# Patient Record
Sex: Male | Born: 1965
Health system: Southern US, Community
[De-identification: ages and names within clinical notes are randomized; demographics above are authoritative.]

## PROBLEM LIST (undated history)

## (undated) DIAGNOSIS — I10 Essential (primary) hypertension: Secondary | ICD-10-CM

## (undated) DIAGNOSIS — T7840XA Allergy, unspecified, initial encounter: Secondary | ICD-10-CM

## (undated) HISTORY — DX: Allergy, unspecified, initial encounter: T78.40XA

## (undated) HISTORY — DX: Essential (primary) hypertension: I10

## (undated) HISTORY — PX: OTHER SURGICAL HISTORY: SHX169

---

## 2004-02-24 ENCOUNTER — Encounter: Admission: RE | Admit: 2004-02-24 | Discharge: 2004-02-24 | Payer: Self-pay | Admitting: Family Medicine

## 2017-08-24 ENCOUNTER — Encounter: Payer: Self-pay | Admitting: Gastroenterology

## 2017-09-19 ENCOUNTER — Ambulatory Visit (AMBULATORY_SURGERY_CENTER): Payer: Self-pay | Admitting: *Deleted

## 2017-09-19 ENCOUNTER — Other Ambulatory Visit: Payer: Self-pay

## 2017-09-19 VITALS — Ht 69.0 in | Wt 265.6 lb

## 2017-09-19 DIAGNOSIS — Z1211 Encounter for screening for malignant neoplasm of colon: Secondary | ICD-10-CM

## 2017-09-19 MED ORDER — NA SULFATE-K SULFATE-MG SULF 17.5-3.13-1.6 GM/177ML PO SOLN: 1.0000 | Freq: Once | ORAL | 0 refills | Status: AC

## 2017-09-19 NOTE — Progress Notes (Signed)
No egg or soy allergy known to patient  No past sedation with any surgeries  or procedures, no past  intubation  No diet pills per patient No home 02 use per patient  No blood thinners per patient  Pt denies issues with constipation  No A fib or A flutter  EMMI video sent to pt's e mail - pt declined

## 2017-10-01 ENCOUNTER — Telehealth: Payer: Self-pay | Admitting: Gastroenterology

## 2017-10-01 NOTE — Telephone Encounter (Signed)
Do you want to charge patient?

## 2017-10-01 NOTE — Telephone Encounter (Signed)
Yes charge 

## 2017-10-03 ENCOUNTER — Encounter: Payer: Self-pay | Admitting: Gastroenterology

## 2018-01-15 ENCOUNTER — Ambulatory Visit (AMBULATORY_SURGERY_CENTER): Payer: Self-pay | Admitting: *Deleted

## 2018-01-15 VITALS — Ht 69.0 in | Wt 269.0 lb

## 2018-01-15 DIAGNOSIS — Z1211 Encounter for screening for malignant neoplasm of colon: Secondary | ICD-10-CM

## 2018-01-15 NOTE — Progress Notes (Signed)
No egg or soy allergy known to patient  No issues with past sedation with any surgeries  or procedures, no intubation problems  No diet pills per patient No home 02 use per patient  No blood thinners per patient  Pt denies issues with constipation  No A fib or A flutter  EMMI video sent to pt's e mail -- pt declined  Pt has a Suprep at home from his 09-19-17 PV

## 2018-01-23 ENCOUNTER — Encounter: Payer: Self-pay | Admitting: Gastroenterology

## 2018-01-23 ENCOUNTER — Ambulatory Visit (AMBULATORY_SURGERY_CENTER): Payer: Commercial Managed Care - PPO | Admitting: Gastroenterology

## 2018-01-23 VITALS — BP 119/74 | HR 69 | Temp 98.9°F | Resp 14 | Ht 69.0 in | Wt 269.0 lb

## 2018-01-23 DIAGNOSIS — Z1211 Encounter for screening for malignant neoplasm of colon: Secondary | ICD-10-CM

## 2018-01-23 DIAGNOSIS — D124 Benign neoplasm of descending colon: Secondary | ICD-10-CM | POA: Diagnosis not present

## 2018-01-23 DIAGNOSIS — D123 Benign neoplasm of transverse colon: Secondary | ICD-10-CM

## 2018-01-23 MED ORDER — SODIUM CHLORIDE 0.9 % IV SOLN: 500.0000 mL | Freq: Once | INTRAVENOUS | Status: DC

## 2018-01-23 NOTE — Progress Notes (Signed)
Called to room to assist during endoscopic procedure.  Patient ID and intended procedure confirmed with present staff. Received instructions for my participation in the procedure from the performing physician.  

## 2018-01-23 NOTE — Progress Notes (Signed)
To PACU, VSS. Report to [email or web address] 

## 2018-01-23 NOTE — Op Note (Signed)
[facility] Patient Name: Jose Warren Procedure Date: 01/23/2018 2:03 PM MRN: [id number] Endoscopist: Ladene Artist , MD Age: 52 Referring MD:  Date of Birth: 08-23-1965 Gender: Male Account #: [id number] Procedure:                Colonoscopy Indications:              Screening for colorectal malignant neoplasm Medicines:                Monitored Anesthesia Care Procedure:                Pre-Anesthesia Assessment:                           - Prior to the procedure, a History and Physical                            was performed, and patient medications and                            allergies were reviewed. The patient's tolerance of                            previous anesthesia was also reviewed. The risks                            and benefits of the procedure and the sedation                            options and risks were discussed with the patient.                            All questions were answered, and informed consent                            was obtained. Prior Anticoagulants: The patient has                            taken no previous anticoagulant or antiplatelet                            agents. ASA Grade Assessment: II - A patient with                            mild systemic disease. After reviewing the risks                            and benefits, the patient was deemed in                            satisfactory condition to undergo the procedure.                           After obtaining informed consent, the colonoscope  was passed under direct vision. Throughout the                            procedure, the patient's blood pressure, pulse, and                            oxygen saturations were monitored continuously. The                            Colonoscope was introduced through the anus and                            advanced to the the cecum, identified by                            appendiceal orifice and  ileocecal valve. The                            ileocecal valve, appendiceal orifice, and rectum                            were photographed. The quality of the bowel                            preparation was excellent. The colonoscopy was                            performed without difficulty. The patient tolerated                            the procedure well. Scope In: 2:09:28 PM Scope Out: 2:24:01 PM Scope Withdrawal Time: 0 hours 12 minutes 20 seconds  Total Procedure Duration: 0 hours 14 minutes 33 seconds  Findings:                 The perianal and digital rectal examinations were                            normal.                           Two sessile polyps were found in the descending                            colon and transverse colon. The polyps were 7 mm in                            size. These polyps were removed with a cold snare.                            Resection and retrieval were complete.                           Multiple medium-mouthed diverticula were found in  the left colon. There was no evidence of                            diverticular bleeding.                           Internal hemorrhoids were found during                            retroflexion. The hemorrhoids were small and Grade                            I (internal hemorrhoids that do not prolapse).                           The exam was otherwise without abnormality on                            direct and retroflexion views. Complications:            No immediate complications. Estimated blood loss:                            None. Estimated Blood Loss:     Estimated blood loss: none. Impression:               - Two 7 mm polyps in the descending colon and in                            the transverse colon, removed with a cold snare.                            Resected and retrieved.                           - Moderate diverticulosis in the left colon.                            - Internal hemorrhoids.                           - The examination was otherwise normal on direct                            and retroflexion views. Recommendation:           - Repeat colonoscopy in 5 years for surveillance if                            polyp(s) are precancerous, otherwise 10 years.                           - Patient has a contact number available for                            emergencies. The signs and symptoms of potential  delayed complications were discussed with the                            patient. Return to normal activities tomorrow.                            Written discharge instructions were provided to the                            patient.                           - High fiber diet.                           - Continue present medications.                           - Await pathology results. Ladene Artist, MD 01/23/2018 2:27:36 PM This report has been signed electronically.

## 2018-01-23 NOTE — Progress Notes (Signed)
Pt's states no medical or surgical changes since previsit or office visit. 

## 2018-01-23 NOTE — Patient Instructions (Signed)
**   Handouts given on polyps and diverticulosis **   YOU HAD AN ENDOSCOPIC PROCEDURE TODAY AT THE [facility]:   Refer to the procedure report that was given to you for any specific questions about what was found during the examination.  If the procedure report does not answer your questions, please call your gastroenterologist to clarify.  If you requested that your care partner not be given the details of your procedure findings, then the procedure report has been included in a sealed envelope for you to review at your convenience later.  YOU SHOULD EXPECT: Some feelings of bloating in the abdomen. Passage of more gas than usual.  Walking can help get rid of the air that was put into your GI tract during the procedure and reduce the bloating. If you had a lower endoscopy (such as a colonoscopy or flexible sigmoidoscopy) you may notice spotting of blood in your stool or on the toilet paper. If you underwent a bowel prep for your procedure, you may not have a normal bowel movement for a few days.  Please Note:  You might notice some irritation and congestion in your nose or some drainage.  This is from the oxygen used during your procedure.  There is no need for concern and it should clear up in a day or so.  SYMPTOMS TO REPORT IMMEDIATELY:   Following lower endoscopy (colonoscopy or flexible sigmoidoscopy):  Excessive amounts of blood in the stool  Significant tenderness or worsening of abdominal pains  Swelling of the abdomen that is new, acute  Fever of 100F or higher  For urgent or emergent issues, a gastroenterologist can be reached at any hour by calling [phone].   DIET:  We do recommend a small meal at first, but then you may proceed to your regular diet.  Drink plenty of fluids but you should avoid alcoholic beverages for 24 hours.  ACTIVITY:  You should plan to take it easy for the rest of today and you should NOT DRIVE or use heavy machinery until tomorrow (because  of the sedation medicines used during the test).    FOLLOW UP: Our staff will call the number listed on your records the next business day following your procedure to check on you and address any questions or concerns that you may have regarding the information given to you following your procedure. If we do not reach you, we will leave a message.  However, if you are feeling well and you are not experiencing any problems, there is no need to return our call.  We will assume that you have returned to your regular daily activities without incident.  If any biopsies were taken you will be contacted by phone or by letter within the next 1-3 weeks.  Please call us at [phone] if you have not heard about the biopsies in 3 weeks.    SIGNATURES/CONFIDENTIALITY: You and/or your care partner have signed paperwork which will be entered into your electronic medical record.  These signatures attest to the fact that that the information above on your After Visit Summary has been reviewed and is understood.  Full responsibility of the confidentiality of this discharge information lies with you and/or your care-partner. 

## 2018-01-24 ENCOUNTER — Telehealth: Payer: Self-pay

## 2018-01-24 NOTE — Telephone Encounter (Signed)
  Follow up Call-  Call back number 01/23/2018  Post procedure Call Back phone  # [phone]  Permission to leave phone message Yes  Some recent data might be hidden     Patient questions:  Do you have a fever, pain , or abdominal swelling? No. Pain Score  0 *  Have you tolerated food without any problems? Yes.    Have you been able to return to your normal activities? Yes.    Do you have any questions about your discharge instructions: Diet   No. Medications  No. Follow up visit  No.  Do you have questions or concerns about your Care? No.  Actions: * If pain score is 4 or above: No action needed, pain <4.

## 2018-02-01 ENCOUNTER — Encounter: Payer: Self-pay | Admitting: Gastroenterology

## 2019-03-06 ENCOUNTER — Other Ambulatory Visit: Payer: Self-pay

## 2019-03-06 DIAGNOSIS — Z20822 Contact with and (suspected) exposure to covid-19: Secondary | ICD-10-CM

## 2019-03-08 LAB — NOVEL CORONAVIRUS, NAA: SARS-CoV-2, NAA: DETECTED — AB

## 2019-03-12 ENCOUNTER — Other Ambulatory Visit: Payer: Self-pay

## 2019-03-12 DIAGNOSIS — Z20822 Contact with and (suspected) exposure to covid-19: Secondary | ICD-10-CM

## 2019-03-14 LAB — NOVEL CORONAVIRUS, NAA: SARS-CoV-2, NAA: DETECTED — AB

## 2019-03-17 ENCOUNTER — Other Ambulatory Visit: Payer: Self-pay | Admitting: Registered"

## 2019-03-17 DIAGNOSIS — Z20822 Contact with and (suspected) exposure to covid-19: Secondary | ICD-10-CM

## 2019-03-18 LAB — NOVEL CORONAVIRUS, NAA: SARS-CoV-2, NAA: DETECTED — AB

## 2019-03-21 ENCOUNTER — Other Ambulatory Visit: Payer: Self-pay

## 2019-03-21 DIAGNOSIS — Z20822 Contact with and (suspected) exposure to covid-19: Secondary | ICD-10-CM

## 2019-03-22 LAB — NOVEL CORONAVIRUS, NAA: SARS-CoV-2, NAA: DETECTED — AB

## 2019-03-27 ENCOUNTER — Other Ambulatory Visit: Payer: Self-pay

## 2019-03-27 DIAGNOSIS — Z20822 Contact with and (suspected) exposure to covid-19: Secondary | ICD-10-CM

## 2019-03-29 LAB — NOVEL CORONAVIRUS, NAA: SARS-CoV-2, NAA: NOT DETECTED

## 2019-05-27 ENCOUNTER — Other Ambulatory Visit: Payer: Commercial Managed Care - PPO

## 2020-01-20 ENCOUNTER — Ambulatory Visit (INDEPENDENT_AMBULATORY_CARE_PROVIDER_SITE_OTHER): Payer: Commercial Managed Care - PPO | Admitting: Cardiovascular Disease

## 2020-01-20 ENCOUNTER — Other Ambulatory Visit: Payer: Self-pay

## 2020-01-20 ENCOUNTER — Encounter: Payer: Self-pay | Admitting: Cardiovascular Disease

## 2020-01-20 VITALS — BP 160/96 | HR 75 | Ht 69.5 in | Wt 264.8 lb

## 2020-01-20 DIAGNOSIS — Z8249 Family history of ischemic heart disease and other diseases of the circulatory system: Secondary | ICD-10-CM | POA: Diagnosis not present

## 2020-01-20 NOTE — Assessment & Plan Note (Signed)
Jose Warren was referred by Dr. Olen Pel for evaluation because of family history of heart disease.  He has no other cardiac risk factors.  He is fairly active is asymptomatic.  He works as a Publishing rights manager.  I Georgina Peer get a 2D echo and a coronary calcium score to further evaluate and risk stratify

## 2020-01-20 NOTE — Patient Instructions (Signed)
Medication Instructions:  The current medical regimen is effective;  continue present plan and medications.  *If you need a refill on your cardiac medications before your next appointment, please call your pharmacy*  Testing/Procedures: Echocardiogram - Your physician has requested that you have an echocardiogram. Echocardiography is a painless test that uses sound waves to create images of your heart. It provides your doctor with information about the size and shape of your heart and how well your heart's chambers and valves are working. This procedure takes approximately one hour. There are no restrictions for this procedure. This will be performed at our [facility] location - [address].  CALCIUM SCORE   Follow-Up: At [facility], you and your health needs are our priority.  As part of our continuing mission to provide you with exceptional heart care, we have created designated Provider Care Teams.  These Care Teams include your primary Cardiologist (physician) and Advanced Practice Providers (APPs -  Physician Assistants and Nurse Practitioners) who all work together to provide you with the care you need, when you need it.  We recommend signing up for the patient portal called "MyChart".  Sign up information is provided on this After Visit Summary.  MyChart is used to connect with patients for Virtual Visits (Telemedicine).  Patients are able to view lab/test results, encounter notes, upcoming appointments, etc.  Non-urgent messages can be sent to your provider as well.   To learn more about what you can do with MyChart, go to [email or web address].    Your next appointment:   As needed  The format for your next appointment:   In Person  Provider:   Quay Burow, MD

## 2020-01-20 NOTE — Progress Notes (Signed)
01/20/2020 YARDLEY LEKAS   04/01/1966  [id number]  Primary Physician Orpah Melter, MD Primary Cardiologist: Lorretta Harp MD Lupe Carney, [place]  HPI:  Jose Warren is a 54 y.o. moderately overweight married Caucasian male father of 3 daughters who works as a Programme researcher, broadcasting/film/video for [employer] who was referred by his PCP, Dr. Olen Pel, for cardiovascular valuation because of family history of heart disease.  His father apparently died of a myocardial infarction at age 62.  He has no other risk factors.  He is fairly active and is asymptomatic.  He denies chest pain or shortness of breath.  Is never had a heart attack or stroke.  He is referred for cardiac evaluation because of his family history.   No outpatient medications have been marked as taking for the 01/20/20 encounter (Office Visit) with Lorretta Harp, MD.     No Known Allergies  Social History   Socioeconomic History  . Marital status: Married    Spouse name: Not on file  . Number of children: Not on file  . Years of education: Not on file  . Highest education level: Not on file  Occupational History  . Not on file  Tobacco Use  . Smoking status: Never Smoker  . Smokeless tobacco: Never Used  Substance and Sexual Activity  . Alcohol use: Yes    Comment: socially  . Drug use: Not Currently  . Sexual activity: Not on file  Other Topics Concern  . Not on file  Social History Narrative  . Not on file   Social Determinants of Health   Financial Resource Strain:   . Difficulty of Paying Living Expenses: Not on file  Food Insecurity:   . Worried About Charity fundraiser in the Last Year: Not on file  . Ran Out of Food in the Last Year: Not on file  Transportation Needs:   . Lack of Transportation (Medical): Not on file  . Lack of Transportation (Non-Medical): Not on file  Physical Activity:   . Days of Exercise per Week: Not on file  . Minutes of Exercise per Session: Not on file  Stress:     . Feeling of Stress : Not on file  Social Connections:   . Frequency of Communication with Friends and Family: Not on file  . Frequency of Social Gatherings with Friends and Family: Not on file  . Attends Religious Services: Not on file  . Active Member of Clubs or Organizations: Not on file  . Attends Archivist Meetings: Not on file  . Marital Status: Not on file  Intimate Partner Violence:   . Fear of Current or Ex-Partner: Not on file  . Emotionally Abused: Not on file  . Physically Abused: Not on file  . Sexually Abused: Not on file     Review of Systems: General: negative for chills, fever, night sweats or weight changes.  Cardiovascular: negative for chest pain, dyspnea on exertion, edema, orthopnea, palpitations, paroxysmal nocturnal dyspnea or shortness of breath Dermatological: negative for rash Respiratory: negative for cough or wheezing Urologic: negative for hematuria Abdominal: negative for nausea, vomiting, diarrhea, bright red blood per rectum, melena, or hematemesis Neurologic: negative for visual changes, syncope, or dizziness All other systems reviewed and are otherwise negative except as noted above.    Blood pressure (!) 160/96, pulse 75, height 5' 9.5" (1.765 m), weight 264 lb 12.8 oz (120.1 kg), SpO2 95 %.  General appearance: alert and no  distress Neck: no adenopathy, no carotid bruit, no JVD, supple, symmetrical, trachea midline and thyroid not enlarged, symmetric, no tenderness/mass/nodules Lungs: clear to auscultation bilaterally Heart: regular rate and rhythm, S1, S2 normal, no murmur, click, rub or gallop Extremities: extremities normal, atraumatic, no cyanosis or edema Pulses: 2+ and symmetric Skin: Skin color, texture, turgor normal. No rashes or lesions Neurologic: Alert and oriented X 3, normal strength and tone. Normal symmetric reflexes. Normal coordination and gait  EKG sinus rhythm at 75 without ST or T wave changes.  I personally  reviewed this EKG.  ASSESSMENT AND PLAN:   Family history of heart disease Mr. Defalco was referred by Dr. Olen Pel for evaluation because of family history of heart disease.  He has no other cardiac risk factors.  He is fairly active is asymptomatic.  He works as a Publishing rights manager.  I Georgina Peer get a 2D echo and a coronary calcium score to further evaluate and risk stratify      Lorretta Harp MD [facility], [facility] 01/20/2020 10:06 AM

## 2020-02-05 ENCOUNTER — Ambulatory Visit (HOSPITAL_COMMUNITY): Payer: Commercial Managed Care - PPO | Attending: Cardiology

## 2020-02-05 ENCOUNTER — Other Ambulatory Visit: Payer: Self-pay

## 2020-02-05 ENCOUNTER — Ambulatory Visit (INDEPENDENT_AMBULATORY_CARE_PROVIDER_SITE_OTHER)
Admission: RE | Admit: 2020-02-05 | Discharge: 2020-02-05 | Disposition: A | Discharge status: Home / Self Care | Payer: Self-pay | Source: Ambulatory Visit | Attending: Cardiovascular Disease | Admitting: Cardiovascular Disease

## 2020-02-05 DIAGNOSIS — Z8249 Family history of ischemic heart disease and other diseases of the circulatory system: Secondary | ICD-10-CM | POA: Diagnosis present

## 2020-02-05 DIAGNOSIS — Z09 Encounter for follow-up examination after completed treatment for conditions other than malignant neoplasm: Secondary | ICD-10-CM | POA: Insufficient documentation

## 2020-02-05 DIAGNOSIS — Z8616 Personal history of COVID-19: Secondary | ICD-10-CM | POA: Diagnosis not present

## 2020-02-05 LAB — ECHOCARDIOGRAM COMPLETE
AR max vel: 2.43 cm2
AV Area VTI: 2.72 cm2
AV Area mean vel: 2.53 cm2
AV Mean grad: 4 mmHg
AV Peak grad: 7.5 mmHg
Ao pk vel: 1.37 m/s
Area-P 1/2: 3.31 cm2
S' Lateral: 2.8 cm

## 2020-05-22 HISTORY — PX: NASAL SEPTUM SURGERY: SHX37

## 2021-03-30 ENCOUNTER — Other Ambulatory Visit: Payer: Self-pay | Admitting: Orthopedic Surgery

## 2021-03-30 DIAGNOSIS — M25511 Pain in right shoulder: Secondary | ICD-10-CM

## 2021-04-24 ENCOUNTER — Other Ambulatory Visit: Payer: Commercial Managed Care - PPO

## 2021-05-01 ENCOUNTER — Other Ambulatory Visit: Payer: Self-pay

## 2021-05-01 ENCOUNTER — Ambulatory Visit
Admission: RE | Admit: 2021-05-01 | Discharge: 2021-05-01 | Disposition: A | Discharge status: Home / Self Care | Payer: Commercial Managed Care - PPO | Source: Ambulatory Visit | Attending: Orthopedic Surgery | Admitting: Orthopedic Surgery

## 2021-05-01 DIAGNOSIS — M25511 Pain in right shoulder: Secondary | ICD-10-CM

## 2021-05-07 ENCOUNTER — Other Ambulatory Visit: Payer: Commercial Managed Care - PPO

## 2022-09-04 ENCOUNTER — Other Ambulatory Visit (HOSPITAL_BASED_OUTPATIENT_CLINIC_OR_DEPARTMENT_OTHER): Payer: Self-pay

## 2022-09-04 MED ORDER — WEGOVY 0.25 MG/0.5ML ~~LOC~~ SOAJ
0.2500 mg | SUBCUTANEOUS | 0 refills | Status: DC
  Filled 2022-09-04: qty 2, 28d supply, fill #0

## 2022-09-10 ENCOUNTER — Other Ambulatory Visit (HOSPITAL_BASED_OUTPATIENT_CLINIC_OR_DEPARTMENT_OTHER): Payer: Self-pay

## 2022-09-20 ENCOUNTER — Encounter: Payer: Self-pay | Admitting: Gastroenterology

## 2022-12-26 ENCOUNTER — Ambulatory Visit (AMBULATORY_SURGERY_CENTER): Payer: Commercial Managed Care - PPO

## 2022-12-26 VITALS — Ht 69.5 in | Wt 250.0 lb

## 2022-12-26 DIAGNOSIS — Z86010 Personal history of colonic polyps: Secondary | ICD-10-CM

## 2022-12-26 MED ORDER — NA SULFATE-K SULFATE-MG SULF 17.5-3.13-1.6 GM/177ML PO SOLN: 1.0000 | Freq: Once | ORAL | 0 refills | Status: AC

## 2022-12-26 NOTE — Progress Notes (Signed)

## 2023-01-08 ENCOUNTER — Other Ambulatory Visit (HOSPITAL_BASED_OUTPATIENT_CLINIC_OR_DEPARTMENT_OTHER): Payer: Self-pay

## 2023-01-08 MED ORDER — ZEPBOUND 10 MG/0.5ML ~~LOC~~ SOAJ
10.0000 mg | SUBCUTANEOUS | 0 refills | Status: AC
  Filled 2023-01-08 – 2023-01-26 (×2): qty 2, 28d supply, fill #0

## 2023-01-09 ENCOUNTER — Other Ambulatory Visit (HOSPITAL_BASED_OUTPATIENT_CLINIC_OR_DEPARTMENT_OTHER): Payer: Self-pay

## 2023-01-12 ENCOUNTER — Encounter: Payer: Self-pay | Admitting: Gastroenterology

## 2023-01-16 ENCOUNTER — Other Ambulatory Visit (HOSPITAL_BASED_OUTPATIENT_CLINIC_OR_DEPARTMENT_OTHER): Payer: Self-pay

## 2023-01-19 ENCOUNTER — Other Ambulatory Visit (HOSPITAL_BASED_OUTPATIENT_CLINIC_OR_DEPARTMENT_OTHER): Payer: Self-pay

## 2023-01-24 ENCOUNTER — Other Ambulatory Visit (HOSPITAL_BASED_OUTPATIENT_CLINIC_OR_DEPARTMENT_OTHER): Payer: Self-pay

## 2023-01-24 ENCOUNTER — Encounter: Payer: Self-pay | Admitting: Gastroenterology

## 2023-01-24 ENCOUNTER — Ambulatory Visit (AMBULATORY_SURGERY_CENTER): Payer: Commercial Managed Care - PPO | Admitting: Gastroenterology

## 2023-01-24 VITALS — BP 126/81 | HR 67 | Temp 97.8°F | Resp 12 | Ht 69.0 in | Wt 250.0 lb

## 2023-01-24 DIAGNOSIS — Z86010 Personal history of colonic polyps: Secondary | ICD-10-CM

## 2023-01-24 DIAGNOSIS — D123 Benign neoplasm of transverse colon: Secondary | ICD-10-CM | POA: Diagnosis not present

## 2023-01-24 DIAGNOSIS — Z09 Encounter for follow-up examination after completed treatment for conditions other than malignant neoplasm: Secondary | ICD-10-CM | POA: Diagnosis present

## 2023-01-24 MED ORDER — SODIUM CHLORIDE 0.9 % IV SOLN: 500.0000 mL | INTRAVENOUS | Status: DC

## 2023-01-24 NOTE — Patient Instructions (Addendum)
High fiber diet. Continue present medications. Await pathology results.   YOU HAD AN ENDOSCOPIC PROCEDURE TODAY AT THE [facility]:   Refer to the procedure report that was given to you for any specific questions about what was found during the examination.  If the procedure report does not answer your questions, please call your gastroenterologist to clarify.  If you requested that your care partner not be given the details of your procedure findings, then the procedure report has been included in a sealed envelope for you to review at your convenience later.  YOU SHOULD EXPECT: Some feelings of bloating in the abdomen. Passage of more gas than usual.  Walking can help get rid of the air that was put into your GI tract during the procedure and reduce the bloating. If you had a lower endoscopy (such as a colonoscopy or flexible sigmoidoscopy) you may notice spotting of blood in your stool or on the toilet paper. If you underwent a bowel prep for your procedure, you may not have a normal bowel movement for a few days.  Please Note:  You might notice some irritation and congestion in your nose or some drainage.  This is from the oxygen used during your procedure.  There is no need for concern and it should clear up in a day or so.  SYMPTOMS TO REPORT IMMEDIATELY:  Following lower endoscopy (colonoscopy or flexible sigmoidoscopy):  Excessive amounts of blood in the stool  Significant tenderness or worsening of abdominal pains  Swelling of the abdomen that is new, acute  Fever of 100F or higher For urgent or emergent issues, a gastroenterologist can be reached at any hour by calling [phone]. Do not use MyChart messaging for urgent concerns.    DIET:  We do recommend a small meal at first, but then you may proceed to your regular diet.  Drink plenty of fluids but you should avoid alcoholic beverages for 24 hours.  ACTIVITY:  You should plan to take it easy for the rest of  today and you should NOT DRIVE or use heavy machinery until tomorrow (because of the sedation medicines used during the test).    FOLLOW UP: Our staff will call the number listed on your records the next business day following your procedure.  We will call around 7:15- 8:00 am to check on you and address any questions or concerns that you may have regarding the information given to you following your procedure. If we do not reach you, we will leave a message.     If any biopsies were taken you will be contacted by phone or by letter within the next 1-3 weeks.  Please call us at [phone] if you have not heard about the biopsies in 3 weeks.    SIGNATURES/CONFIDENTIALITY: You and/or your care partner have signed paperwork which will be entered into your electronic medical record.  These signatures attest to the fact that that the information above on your After Visit Summary has been reviewed and is understood.  Full responsibility of the confidentiality of this discharge information lies with you and/or your care-partner.

## 2023-01-24 NOTE — Op Note (Signed)
[facility] Patient Name: Jose Warren Procedure Date: 01/24/2023 8:53 AM MRN: [id number] Endoscopist: Meryl Dare , MD, [phone] Age: 57 Referring MD:  Date of Birth: Apr 07, 1966 Gender: Male Account #: [id number] Procedure:                Colonoscopy Indications:              Surveillance: Personal history of adenomatous                            polyps on last colonoscopy 5 years ago Medicines:                Monitored Anesthesia Care Procedure:                Pre-Anesthesia Assessment:                           - Prior to the procedure, a History and Physical                            was performed, and patient medications and                            allergies were reviewed. The patient's tolerance of                            previous anesthesia was also reviewed. The risks                            and benefits of the procedure and the sedation                            options and risks were discussed with the patient.                            All questions were answered, and informed consent                            was obtained. Prior Anticoagulants: The patient has                            taken no anticoagulant or antiplatelet agents. ASA                            Grade Assessment: II - A patient with mild systemic                            disease. After reviewing the risks and benefits,                            the patient was deemed in satisfactory condition to                            undergo the procedure.  After obtaining informed consent, the colonoscope                            was passed under direct vision. Throughout the                            procedure, the patient's blood pressure, pulse, and                            oxygen saturations were monitored continuously. The                            Olympus CF-HQ190L ([id number]) Colonoscope was                            introduced through the  anus and advanced to the the                            cecum, identified by appendiceal orifice and                            ileocecal valve. The ileocecal valve, appendiceal                            orifice, and rectum were photographed. The quality                            of the bowel preparation was good. The colonoscopy                            was performed without difficulty. The patient                            tolerated the procedure well. Scope In: 8:58:01 AM Scope Out: 9:15:09 AM Scope Withdrawal Time: 0 hours 12 minutes 46 seconds  Total Procedure Duration: 0 hours 17 minutes 8 seconds  Findings:                 The perianal and digital rectal examinations were                            normal.                           A 10 mm polyp was found in the distal transverse                            colon. The polyp was sessile. The polyp was removed                            with a cold snare. Resection and retrieval were                            complete.  Multiple medium-mouthed and small-mouthed                            diverticula were found in the left colon. There was                            evidence of diverticular spasm. There was no                            evidence of diverticular bleeding.                           External hemorrhoids were found during                            retroflexion. The hemorrhoids were small.                           The exam was otherwise without abnormality on                            direct and retroflexion views. Complications:            No immediate complications. Estimated blood loss:                            None. Estimated Blood Loss:     Estimated blood loss: none. Impression:               - One 10 mm polyp in the distal transverse colon,                            removed with a cold snare. Resected and retrieved.                           - Moderate diverticulosis in the left  colon.                           - External hemorrhoids.                           - The examination was otherwise normal on direct                            and retroflexion views. Recommendation:           - Repeat colonoscopy after studies are complete for                            surveillance based on pathology results.                           - Patient has a contact number available for                            emergencies. The signs and symptoms of potential  delayed complications were discussed with the                            patient. Return to normal activities tomorrow.                            Written discharge instructions were provided to the                            patient.                           - High fiber diet.                           - Continue present medications.                           - Await pathology results. Meryl Dare, MD 01/24/2023 9:19:02 AM This report has been signed electronically.

## 2023-01-24 NOTE — Progress Notes (Signed)
Pt's states no medical or surgical changes since previsit or office visit. 

## 2023-01-24 NOTE — Progress Notes (Signed)
Called to room to assist during endoscopic procedure.  Patient ID and intended procedure confirmed with present staff. Received instructions for my participation in the procedure from the performing physician.  

## 2023-01-24 NOTE — Progress Notes (Signed)
Vss nad trans to pacu 

## 2023-01-24 NOTE — Progress Notes (Signed)
History & Physical  Primary Care Physician:  Joycelyn Rua, MD Primary Gastroenterologist: Claudette Head, MD  Impression / Plan:  Personal history of adenomatous colon polyps for surveillance colonoscopy  CHIEF COMPLAINT:  Personal history of colon polyps   HPI: Jose Warren is a 57 y.o. male with a personal history of adenomatous colon polyps for surveillance colonoscopy.    Past Medical History:  Diagnosis Date   Allergy    mild   Hypertension     Past Surgical History:  Procedure Laterality Date   broken nose     as child   NASAL SEPTUM SURGERY  2022    Prior to Admission medications   Medication Sig Start Date End Date Taking? Authorizing Provider  losartan (COZAAR) 50 MG tablet 1 tablet Orally Once a day 08/01/21  Yes [provider]  Semaglutide, 2 MG/DOSE, (OZEMPIC, 2 MG/DOSE,) 8 MG/3ML SOPN 0.51 mg. 10/17/22   [provider]  tirzepatide (ZEPBOUND) 10 MG/0.5ML Pen Inject 10 mg into the skin once a week. 01/08/23       Current Outpatient Medications  Medication Sig Dispense Refill   losartan (COZAAR) 50 MG tablet 1 tablet Orally Once a day     Semaglutide, 2 MG/DOSE, (OZEMPIC, 2 MG/DOSE,) 8 MG/3ML SOPN 0.51 mg.     tirzepatide (ZEPBOUND) 10 MG/0.5ML Pen Inject 10 mg into the skin once a week. 2 mL 0   Current Facility-Administered Medications  Medication Dose Route Frequency Provider Last Rate Last Admin   0.9 %  sodium chloride infusion  500 mL Intravenous Continuous Meryl Dare, MD        Allergies as of 01/24/2023   (No Known Allergies)    Family History  Problem Relation Age of Onset   Colon cancer Neg Hx    Colon polyps Neg Hx    Esophageal cancer Neg Hx    Rectal cancer Neg Hx    Stomach cancer Neg Hx     Social History   Socioeconomic History   Marital status: Married    Spouse name: Not on file   Number of children: Not on file   Years of education: Not on file   Highest education level: Not on file   Occupational History   Not on file  Tobacco Use   Smoking status: Never   Smokeless tobacco: Never  Substance and Sexual Activity   Alcohol use: Yes    Comment: socially   Drug use: Not Currently   Sexual activity: Not on file  Other Topics Concern   Not on file  Social History Narrative   Not on file   Social Determinants of Health   Financial Resource Strain: Not on file  Food Insecurity: Not on file  Transportation Needs: Not on file  Physical Activity: Not on file  Stress: Not on file  Social Connections: Unknown (09/30/2021)   Received from [facility], [facility]   Social Network    Social Network: Not on file  Intimate Partner Violence: Unknown (08/22/2021)   Received from [facility], [facility]   HITS    Physically Hurt: Not on file    Insult or Talk Down To: Not on file    Threaten Physical Harm: Not on file    Scream or Curse: Not on file    Review of Systems:  All systems reviewed were negative except where noted in HPI.   Physical Exam:  General:  Alert, well-developed, in NAD Head:  Normocephalic and atraumatic. Eyes:  Sclera clear, no icterus.   Conjunctiva pink. Ears:  Normal auditory acuity. Mouth:  No deformity or lesions.  Neck:  Supple; no masses. Lungs:  Clear throughout to auscultation.   No wheezes, crackles, or rhonchi.  Heart:  Regular rate and rhythm; no murmurs. Abdomen:  Soft, nondistended, nontender. No masses, hepatomegaly. No palpable masses.  Normal bowel sounds.    Rectal:  Deferred   Msk:  Symmetrical without gross deformities. Extremities:  Without edema. Neurologic:  Alert and  oriented x 4; grossly normal neurologically. Skin:  Intact without significant lesions or rashes. Psych:  Alert and cooperative. Normal mood and affect.   Venita Lick. Russella Dar  01/24/2023, 8:51 AM See Loretha Stapler, [facility], to contact our on call provider

## 2023-01-25 ENCOUNTER — Telehealth: Payer: Self-pay | Admitting: *Deleted

## 2023-01-25 NOTE — Telephone Encounter (Signed)
No answer on  follow up call. Left message.   

## 2023-01-26 ENCOUNTER — Other Ambulatory Visit (HOSPITAL_BASED_OUTPATIENT_CLINIC_OR_DEPARTMENT_OTHER): Payer: Self-pay

## 2023-01-31 ENCOUNTER — Encounter: Payer: Self-pay | Admitting: Gastroenterology

## 2023-02-20 ENCOUNTER — Other Ambulatory Visit (HOSPITAL_BASED_OUTPATIENT_CLINIC_OR_DEPARTMENT_OTHER): Payer: Self-pay

## 2023-02-20 ENCOUNTER — Other Ambulatory Visit: Payer: Self-pay

## 2023-02-20 MED ORDER — ZEPBOUND 12.5 MG/0.5ML ~~LOC~~ SOAJ
12.5000 mg | SUBCUTANEOUS | 0 refills | Status: DC
  Filled 2023-02-20: qty 2, 28d supply, fill #0

## 2023-03-29 ENCOUNTER — Other Ambulatory Visit (HOSPITAL_BASED_OUTPATIENT_CLINIC_OR_DEPARTMENT_OTHER): Payer: Self-pay

## 2023-03-30 ENCOUNTER — Other Ambulatory Visit (HOSPITAL_BASED_OUTPATIENT_CLINIC_OR_DEPARTMENT_OTHER): Payer: Self-pay

## 2023-03-30 MED ORDER — ZEPBOUND 12.5 MG/0.5ML ~~LOC~~ SOAJ
12.5000 mg | SUBCUTANEOUS | 0 refills | Status: AC
  Filled 2023-03-30: qty 2, 28d supply, fill #0

## 2023-04-02 ENCOUNTER — Other Ambulatory Visit (HOSPITAL_BASED_OUTPATIENT_CLINIC_OR_DEPARTMENT_OTHER): Payer: Self-pay

## 2023-04-02 MED ORDER — ZEPBOUND 15 MG/0.5ML ~~LOC~~ SOAJ
15.0000 mg | SUBCUTANEOUS | 3 refills | Status: AC
  Filled 2023-04-02: qty 2, 28d supply, fill #0
  Filled 2023-04-27: qty 2, 28d supply, fill #1
  Filled 2023-05-25: qty 2, 28d supply, fill #0
  Filled 2023-05-25: qty 2, 28d supply, fill #2

## 2023-04-27 ENCOUNTER — Other Ambulatory Visit (HOSPITAL_BASED_OUTPATIENT_CLINIC_OR_DEPARTMENT_OTHER): Payer: Self-pay

## 2023-04-27 ENCOUNTER — Other Ambulatory Visit: Payer: Self-pay

## 2023-05-14 ENCOUNTER — Other Ambulatory Visit (HOSPITAL_BASED_OUTPATIENT_CLINIC_OR_DEPARTMENT_OTHER): Payer: Self-pay

## 2023-05-14 MED ORDER — METFORMIN HCL ER 500 MG PO TB24
1000.0000 mg | ORAL_TABLET | Freq: Every day | ORAL | 0 refills | Status: DC
  Filled 2023-05-14 – 2023-05-25 (×3): qty 60, 30d supply, fill #0

## 2023-05-24 ENCOUNTER — Other Ambulatory Visit: Payer: Self-pay

## 2023-05-24 ENCOUNTER — Other Ambulatory Visit (HOSPITAL_BASED_OUTPATIENT_CLINIC_OR_DEPARTMENT_OTHER): Payer: Self-pay

## 2023-05-25 ENCOUNTER — Other Ambulatory Visit (HOSPITAL_BASED_OUTPATIENT_CLINIC_OR_DEPARTMENT_OTHER): Payer: Self-pay

## 2023-05-28 ENCOUNTER — Other Ambulatory Visit (HOSPITAL_BASED_OUTPATIENT_CLINIC_OR_DEPARTMENT_OTHER): Payer: Self-pay

## 2023-06-25 ENCOUNTER — Other Ambulatory Visit (HOSPITAL_BASED_OUTPATIENT_CLINIC_OR_DEPARTMENT_OTHER): Payer: Self-pay

## 2023-06-25 MED ORDER — ZEPBOUND 15 MG/0.5ML ~~LOC~~ SOAJ
15.0000 mg | SUBCUTANEOUS | 3 refills | Status: AC
  Filled 2023-06-25 – 2023-06-26 (×2): qty 2, 28d supply, fill #0
  Filled 2023-08-17: qty 2, 28d supply, fill #1

## 2023-06-25 MED ORDER — METFORMIN HCL ER 500 MG PO TB24
1000.0000 mg | ORAL_TABLET | Freq: Two times a day (BID) | ORAL | 1 refills | Status: AC
  Filled 2023-06-25: qty 120, 30d supply, fill #0

## 2023-06-26 ENCOUNTER — Other Ambulatory Visit (HOSPITAL_BASED_OUTPATIENT_CLINIC_OR_DEPARTMENT_OTHER): Payer: Self-pay

## 2023-07-13 IMAGING — MR MR SHOULDER*R* W/O CM
5 series · 36 of 40 positions shown · non-contrast
Comparison: None.

CLINICAL DATA: Right shoulder pain for 10 months.

EXAM:
MRI OF THE RIGHT SHOULDER WITHOUT CONTRAST
TECHNIQUE: Multiplanar, multisequence MR imaging of the shoulder was performed.
No intravenous contrast was administered.

[Series 3: T2 fat-sat · axial · 4.5mm · 0.62mm/px · z∈[-52,+75]mm · 8 of 26 slices shown (1 of 3)]
[im 1/26]
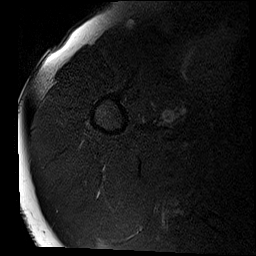
[im 3/26]
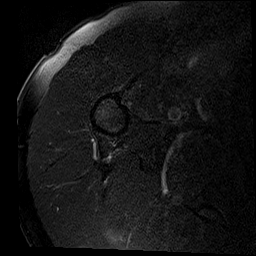
[im 9/26]
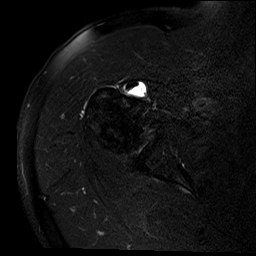
[im 12/26]
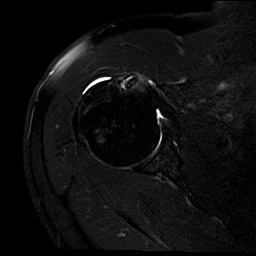
[im 14/26]
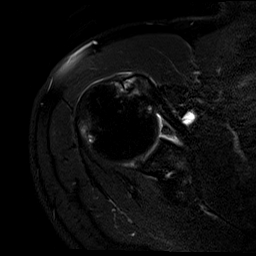
[im 17/26]
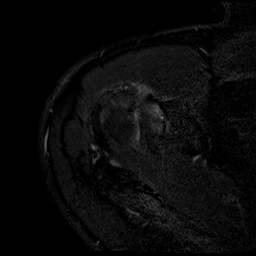
[im 23/26]
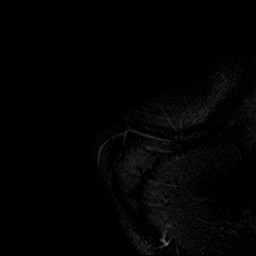
[im 26/26]
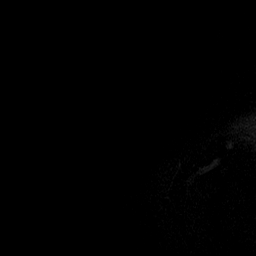

[Series 4: T2 fat-sat · oblique · 4.5mm · 0.62mm/px · 7 of 21 slices shown (2 of 3)]
[im 1/21]
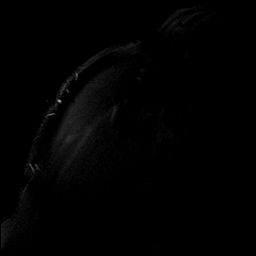
[im 4/21]
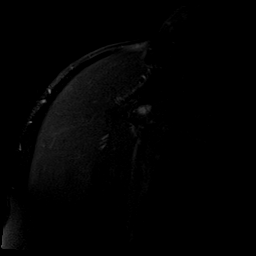
[im 7/21]
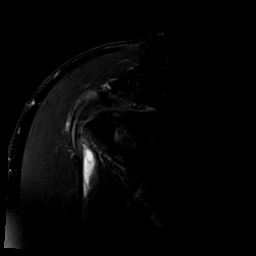
[im 11/21]
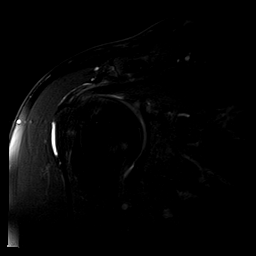
[im 14/21]
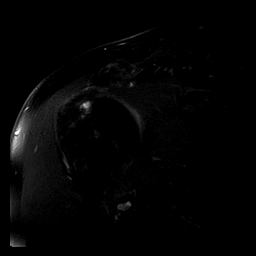
[im 17/21]
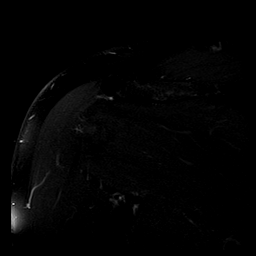
[im 21/21]
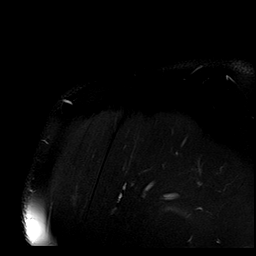

[Series 5: PD · oblique · 4.5mm · 0.31mm/px · 7 of 21 slices shown]
[im 1/21]
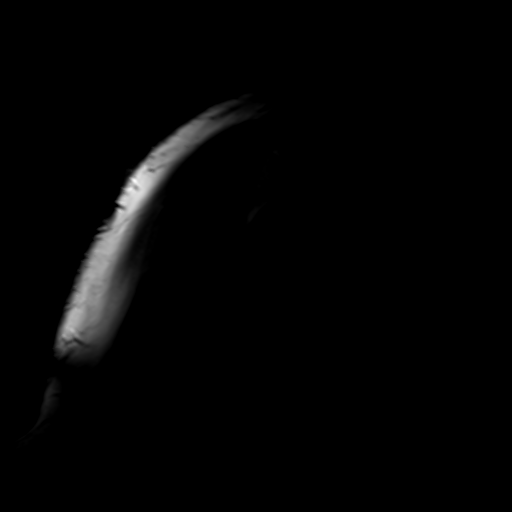
[im 4/21]
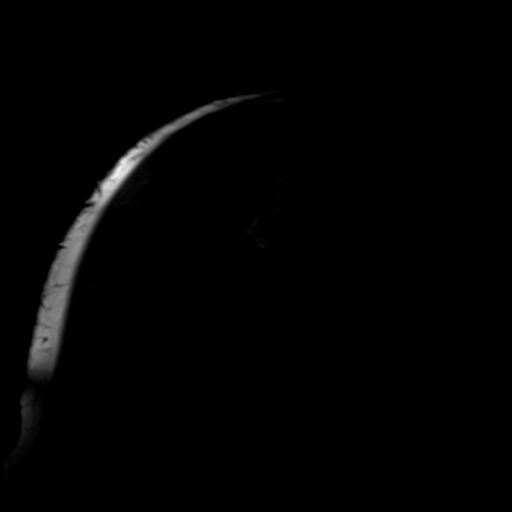
[im 7/21]
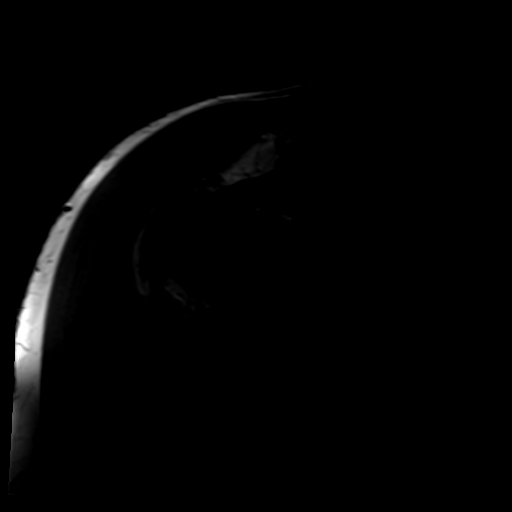
[im 11/21]
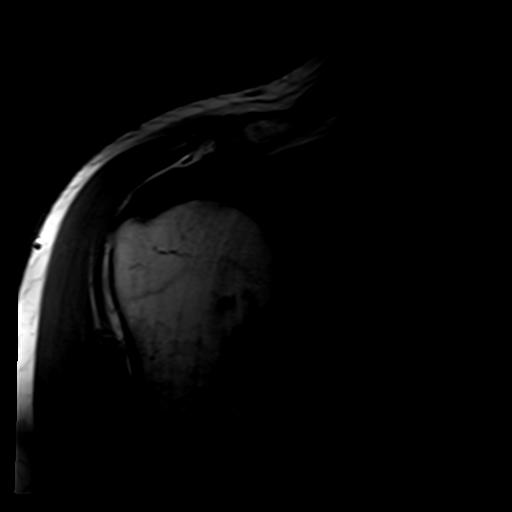
[im 14/21]
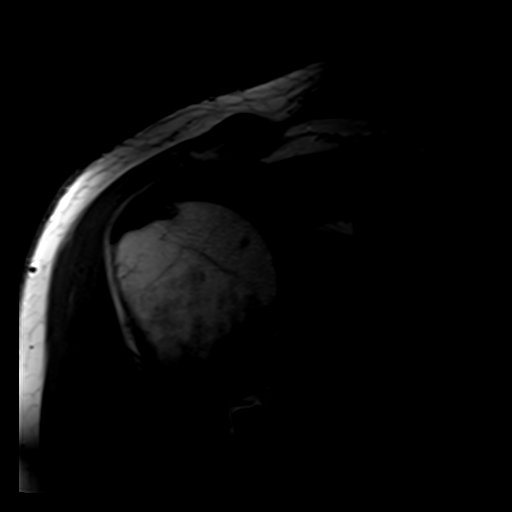
[im 17/21]
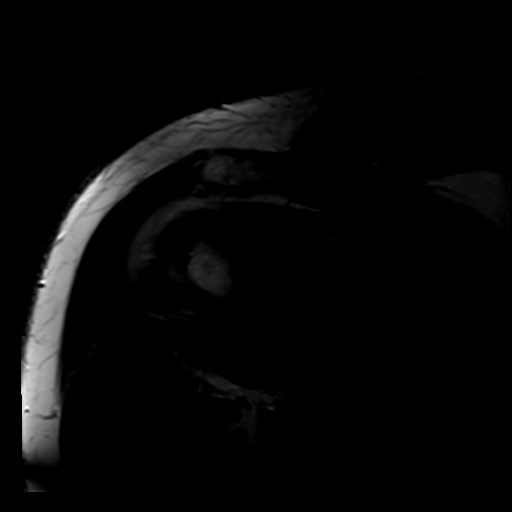
[im 21/21]
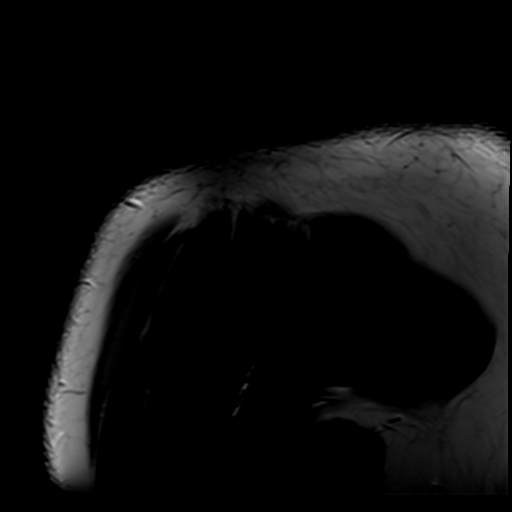

[Series 6: T2 fat-sat · oblique · 4.5mm · 0.62mm/px · 8 of 25 slices shown (3 of 3)]
[im 1/25]
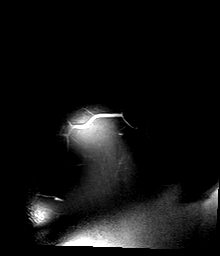
[im 4/25]
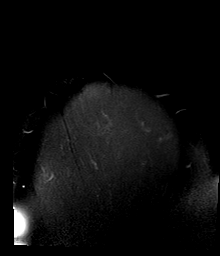
[im 7/25]
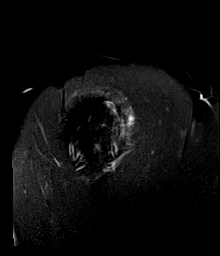
[im 11/25]
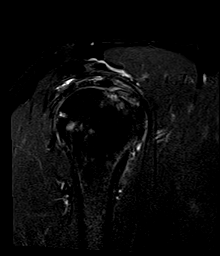
[im 14/25]
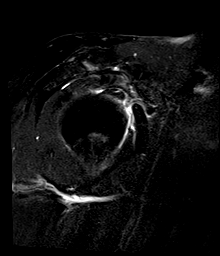
[im 18/25]
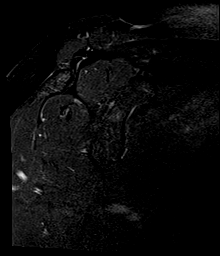
[im 21/25]
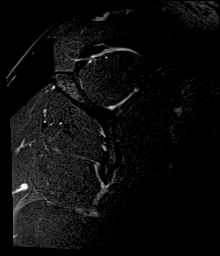
[im 25/25]
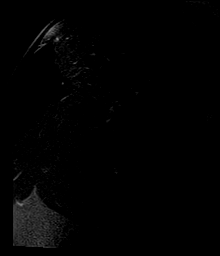

[Series 7: T1 · oblique · 4.5mm · 0.31mm/px · 6 of 25 slices shown]
[im 1/25]
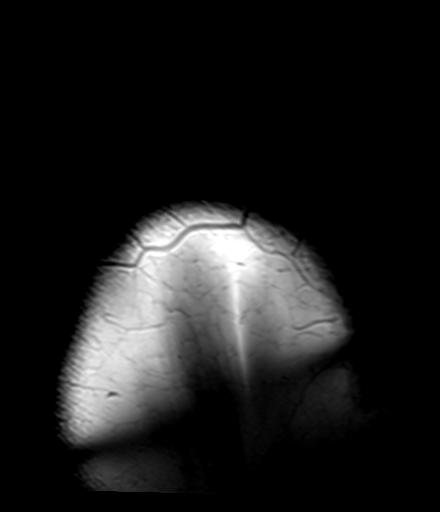
[im 4/25]
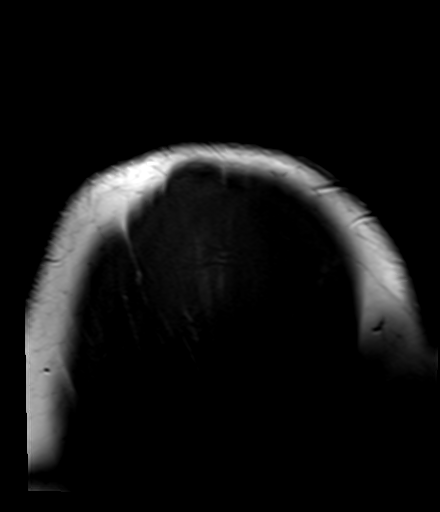
[im 7/25]
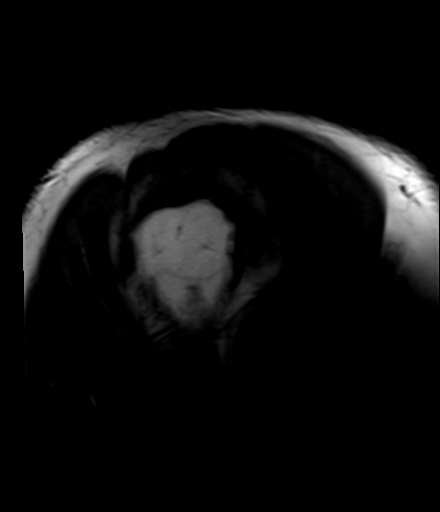
[im 11/25]
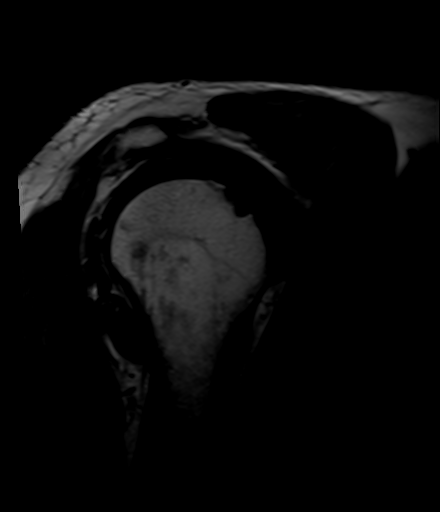
[im 14/25]
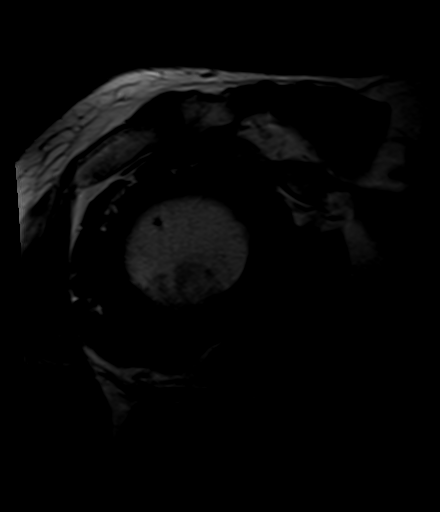
[im 18/25]
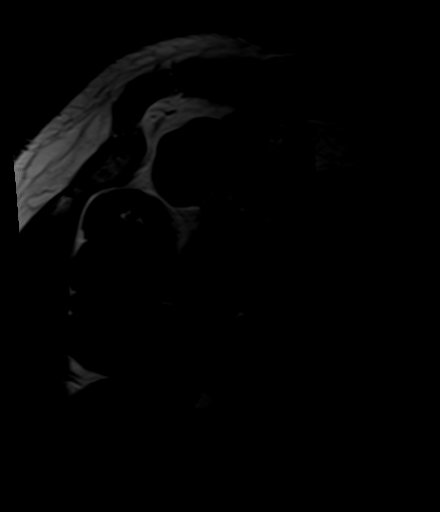

[36 of 40 positions shown; findings below may reference images not displayed]

FINDINGS: Rotator cuff: Severe tendinosis of the supraspinatus tendon with
subcortical reactive bone marrow edema. Mild tendinosis of the
infraspinatus tendon. Teres minor tendon is intact. Mild tendinosis
of the subscapularis tendon.

Muscles: No atrophy or abnormal signal of the muscles of the rotator
cuff.

Biceps long head: Severe tendinosis of the intra-articular portion
of the long head of the biceps tendon extending into the proximal
extra-articular portion.

Acromioclavicular Joint: Mild arthropathy of the acromioclavicular
joint. Type I acromion. Trace subacromial/subdeltoid bursal fluid.

Glenohumeral Joint: No joint effusion.  No chondral defect.

Labrum: Grossly intact, but evaluation is limited by lack of
intraarticular fluid.

Bones:  No acute osseous abnormality.  No aggressive osseous lesion.

Other: None.
IMPRESSION: 1. Severe tendinosis of the supraspinatus tendon with subcortical
reactive bone marrow edema.
2. Mild tendinosis of the infraspinatus tendon.
3. Mild tendinosis of the subscapularis tendon.
4. Severe tendinosis of the intra-articular portion of the long head
of the biceps tendon extending into the proximal extra-articular
portion.

## 2023-07-25 ENCOUNTER — Other Ambulatory Visit: Payer: Self-pay | Admitting: Family Medicine

## 2023-07-25 DIAGNOSIS — E785 Hyperlipidemia, unspecified: Secondary | ICD-10-CM

## 2023-07-26 ENCOUNTER — Ambulatory Visit (INDEPENDENT_AMBULATORY_CARE_PROVIDER_SITE_OTHER)

## 2023-07-26 DIAGNOSIS — E785 Hyperlipidemia, unspecified: Secondary | ICD-10-CM

## 2023-08-06 ENCOUNTER — Other Ambulatory Visit (HOSPITAL_BASED_OUTPATIENT_CLINIC_OR_DEPARTMENT_OTHER): Payer: Self-pay

## 2023-08-06 MED ORDER — METFORMIN HCL ER 500 MG PO TB24
1000.0000 mg | ORAL_TABLET | Freq: Two times a day (BID) | ORAL | 1 refills | Status: AC
  Filled 2023-08-06 – 2023-08-21 (×3): qty 360, 90d supply, fill #0

## 2023-08-16 ENCOUNTER — Other Ambulatory Visit (HOSPITAL_BASED_OUTPATIENT_CLINIC_OR_DEPARTMENT_OTHER): Payer: Self-pay

## 2023-08-17 ENCOUNTER — Other Ambulatory Visit (HOSPITAL_BASED_OUTPATIENT_CLINIC_OR_DEPARTMENT_OTHER): Payer: Self-pay

## 2023-08-21 ENCOUNTER — Other Ambulatory Visit (HOSPITAL_BASED_OUTPATIENT_CLINIC_OR_DEPARTMENT_OTHER): Payer: Self-pay

## 2023-10-01 ENCOUNTER — Other Ambulatory Visit (HOSPITAL_BASED_OUTPATIENT_CLINIC_OR_DEPARTMENT_OTHER): Payer: Self-pay

## 2023-10-01 MED ORDER — ZEPBOUND 15 MG/0.5ML ~~LOC~~ SOAJ
15.0000 mg | SUBCUTANEOUS | 3 refills | Status: AC
  Filled 2023-10-01: qty 2, 28d supply, fill #0
  Filled 2023-12-05: qty 2, 28d supply, fill #1

## 2023-11-15 ENCOUNTER — Other Ambulatory Visit (HOSPITAL_BASED_OUTPATIENT_CLINIC_OR_DEPARTMENT_OTHER): Payer: Self-pay

## 2023-11-15 MED ORDER — METFORMIN HCL ER 500 MG PO TB24
1000.0000 mg | ORAL_TABLET | Freq: Two times a day (BID) | ORAL | 1 refills | Status: AC
  Filled 2023-11-15 – 2023-12-05 (×2): qty 360, 90d supply, fill #0

## 2023-11-27 ENCOUNTER — Other Ambulatory Visit (HOSPITAL_BASED_OUTPATIENT_CLINIC_OR_DEPARTMENT_OTHER): Payer: Self-pay

## 2023-12-05 ENCOUNTER — Other Ambulatory Visit (HOSPITAL_BASED_OUTPATIENT_CLINIC_OR_DEPARTMENT_OTHER): Payer: Self-pay

## 2023-12-25 ENCOUNTER — Other Ambulatory Visit (HOSPITAL_BASED_OUTPATIENT_CLINIC_OR_DEPARTMENT_OTHER): Payer: Self-pay

## 2023-12-25 MED ORDER — ZEPBOUND 15 MG/0.5ML ~~LOC~~ SOAJ
15.0000 mg | SUBCUTANEOUS | 3 refills | Status: AC
  Filled 2023-12-25 – 2024-01-23 (×2): qty 2, 28d supply, fill #0

## 2024-01-23 ENCOUNTER — Other Ambulatory Visit (HOSPITAL_BASED_OUTPATIENT_CLINIC_OR_DEPARTMENT_OTHER): Payer: Self-pay
